# Patient Record
Sex: Male | Born: 2000 | Race: Black or African American | Hispanic: No | Marital: Single | Smoking: Never smoker
Health system: Southern US, Community
[De-identification: ages and names within clinical notes are randomized; demographics above are authoritative.]

## PROBLEM LIST (undated history)

## (undated) DIAGNOSIS — J45909 Unspecified asthma, uncomplicated: Secondary | ICD-10-CM

## (undated) DIAGNOSIS — Z974 Presence of external hearing-aid: Secondary | ICD-10-CM

---
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
## 2022-06-13 IMAGING — CR DG ANKLE COMPLETE 3+V*L*
3 series · 3 of 3 positions shown · non-contrast
Comparison: None.

CLINICAL DATA: Left ankle pain, wrestling injury

EXAM:
LEFT ANKLE COMPLETE - 3+ VIEW

[ankle ap]
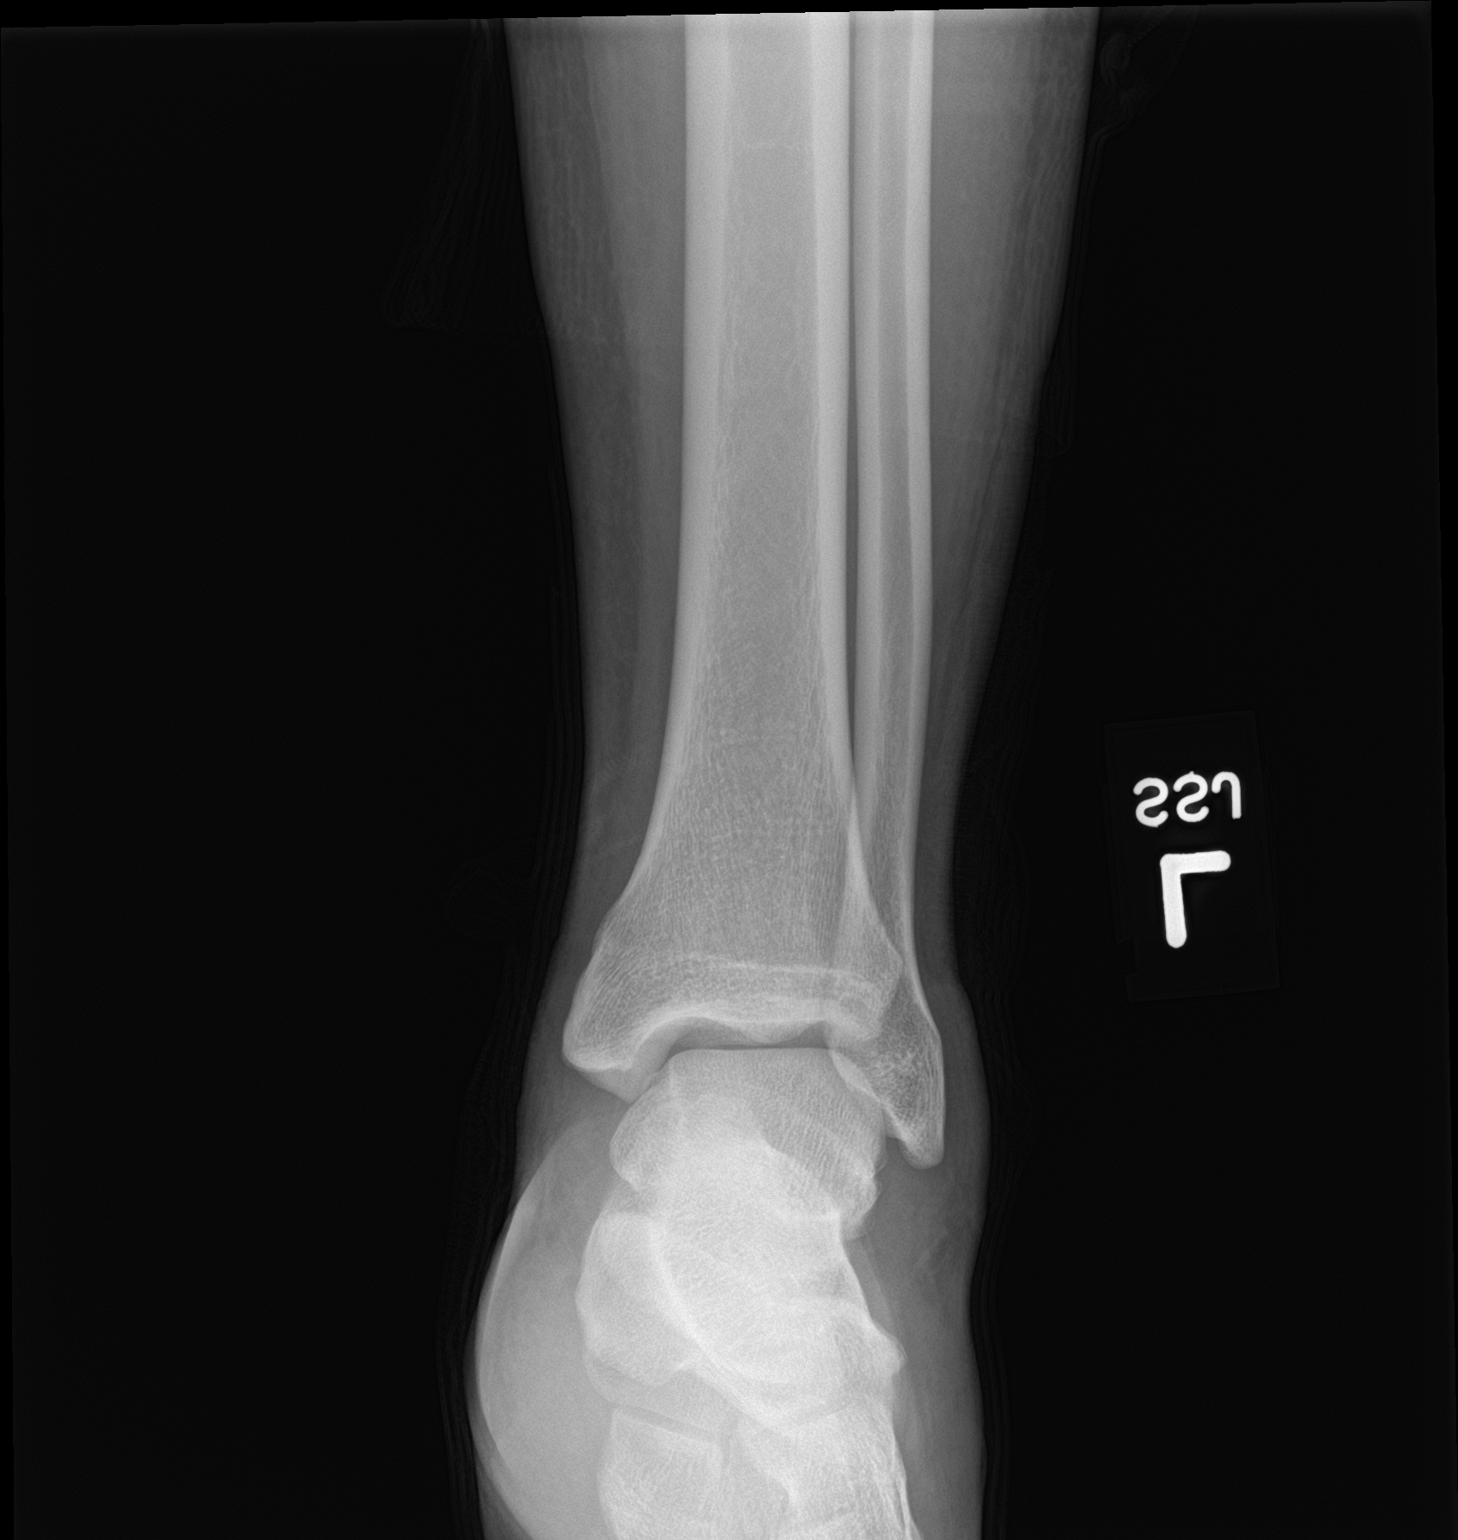

[ankle obl]
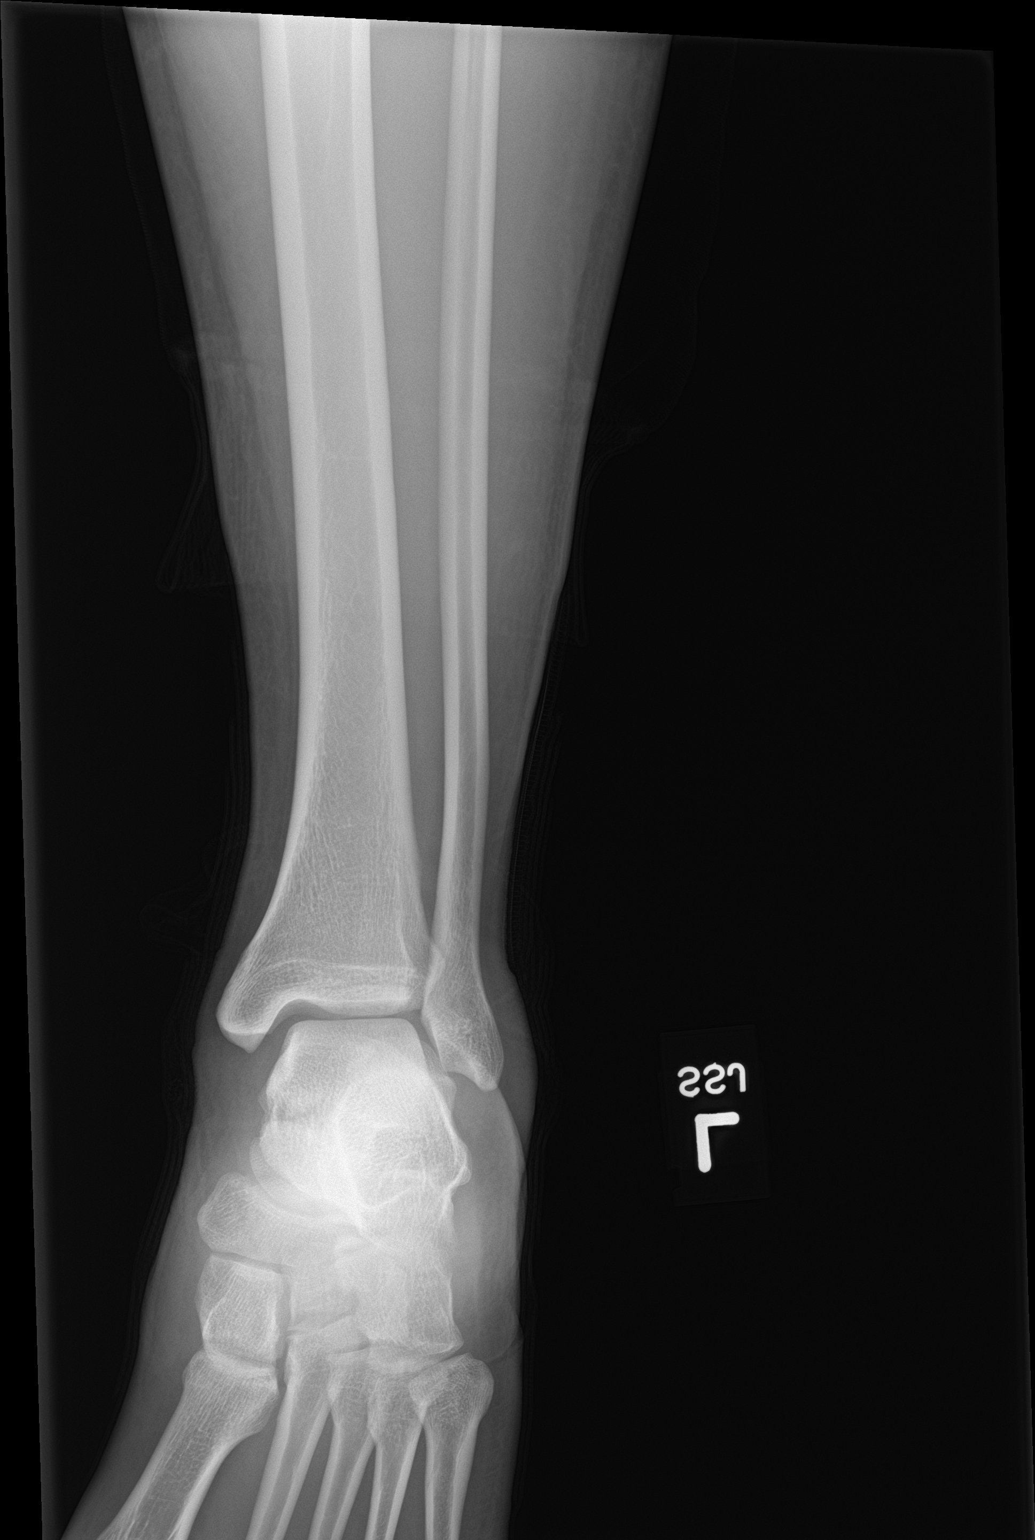

[ankle lat]
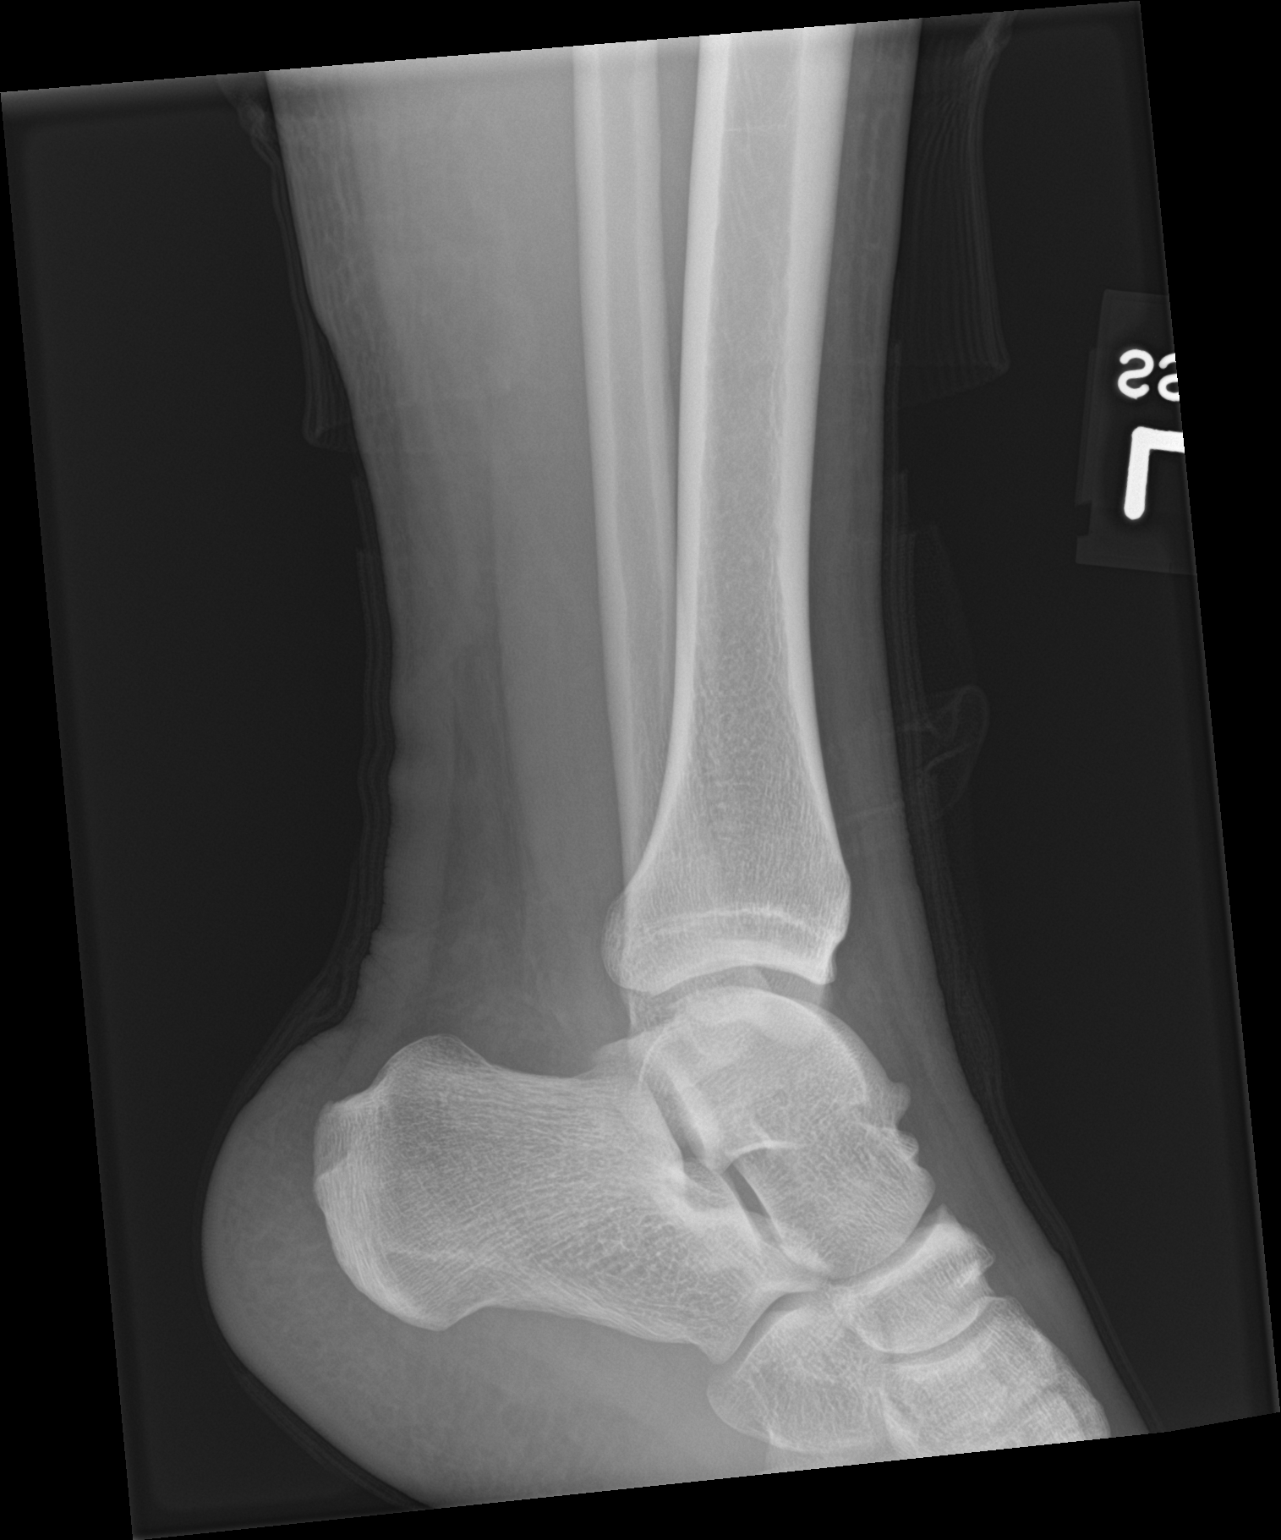

[3 of 3 positions shown; findings below may reference images not displayed]

FINDINGS: Frontal, oblique, and lateral views of the left ankle are obtained.
No fracture, subluxation, or dislocation. Joint spaces are well
preserved. Mild lateral soft tissue swelling.
IMPRESSION: 1. Lateral soft tissue swelling, no acute fracture.

## 2022-10-12 ENCOUNTER — Ambulatory Visit
Admission: EM | Admit: 2022-10-12 | Discharge: 2022-10-12 | Disposition: A | Discharge status: Home / Self Care | Payer: Commercial Managed Care - PPO | Attending: Emergency Medicine | Admitting: Emergency Medicine

## 2022-10-12 DIAGNOSIS — R051 Acute cough: Secondary | ICD-10-CM | POA: Diagnosis present

## 2022-10-12 DIAGNOSIS — Z1152 Encounter for screening for COVID-19: Secondary | ICD-10-CM | POA: Insufficient documentation

## 2022-10-12 DIAGNOSIS — J45901 Unspecified asthma with (acute) exacerbation: Secondary | ICD-10-CM | POA: Diagnosis not present

## 2022-10-12 DIAGNOSIS — J0100 Acute maxillary sinusitis, unspecified: Secondary | ICD-10-CM | POA: Diagnosis not present

## 2022-10-12 DIAGNOSIS — Z7952 Long term (current) use of systemic steroids: Secondary | ICD-10-CM | POA: Insufficient documentation

## 2022-10-12 DIAGNOSIS — Z792 Long term (current) use of antibiotics: Secondary | ICD-10-CM | POA: Insufficient documentation

## 2022-10-12 MED ORDER — PREDNISONE 10 MG PO TABS
40.0000 mg | ORAL_TABLET | Freq: Every day | ORAL | 0 refills | Status: AC
Start: 2022-10-12 — End: 2022-10-17

## 2022-10-12 MED ORDER — AZITHROMYCIN 250 MG PO TABS: 250.0000 mg | ORAL_TABLET | Freq: Every day | ORAL | 0 refills | Status: AC

## 2022-10-12 NOTE — Discharge Instructions (Addendum)
Continue to use your albuterol inhaler.  Take the prednisone and Zithromax as directed.  Follow up with your primary care provider.

## 2022-10-12 NOTE — ED Provider Notes (Signed)
Ronald Scott    CSN: [id number] Arrival date & time: 10/12/22  1343      History   Chief Complaint Chief Complaint  Patient presents with   Cough   chest congestion   Dizziness    HPI Ronald Scott is a 21 y.o. male.  Patient presents with 2-week history of congestion, cough, wheezing, shortness of breath.  Treatment attempted with OTC cold medications and his albuterol inhaler.  He denies fever, chills, sore throat, vomiting, diarrhea, or other symptoms.  His medical history includes asthma.   The history is provided by the patient and medical records.    Past Medical History:  Diagnosis Date   Asthma    Hearing aid worn     There are no problems to display for this patient.   History reviewed. No pertinent surgical history.     Home Medications    Prior to Admission medications   Medication Sig Start Date End Date Taking? Authorizing Provider  albuterol (PROVENTIL HFA;VENTOLIN HFA) 108 (90 BASE) MCG/ACT inhaler Inhale 4 puffs into the lungs every 4 (four) hours as needed for wheezing or shortness of breath. 04/07/15  Yes Loleta Rose, MD  azithromycin (ZITHROMAX) 250 MG tablet Take 1 tablet (250 mg total) by mouth daily. Take first 2 tablets together, then 1 every day until finished. 10/12/22  Yes Mickie Bail, NP  predniSONE (DELTASONE) 10 MG tablet Take 4 tablets (40 mg total) by mouth daily for 5 days. 10/12/22 10/17/22 Yes Mickie Bail, NP  albuterol (PROVENTIL) (2.5 MG/3ML) 0.083% nebulizer solution TAKE 1 VIAL VIA NEB EVERY 4 HOURS AS NEEDED FOR WHEEZE OR COUGH OR DIFFICULTY BREATHING 10/10/20 10/10/21  Page, Baxter Hire, MD  amphetamine-dextroamphetamine (ADDERALL XR) 20 MG 24 hr capsule TAKE 1 CAPSULE BY MOUTH ONCE DAILY 10/10/20 04/08/21  Page, Baxter Hire, MD  Dexmethylphenidate HCl (FOCALIN XR PO) Take by mouth.    [provider]    Family History History reviewed. No pertinent family history.  Social History Social History    Tobacco Use   Smoking status: Never   Smokeless tobacco: Never  Substance Use Topics   Alcohol use: No   Drug use: No     Allergies   Ibuprofen   Review of Systems Review of Systems  Constitutional:  Negative for chills and fever.  HENT:  Positive for congestion. Negative for ear pain and sore throat.   Respiratory:  Positive for cough, shortness of breath and wheezing.   Cardiovascular:  Negative for chest pain and palpitations.  Gastrointestinal:  Negative for diarrhea and vomiting.  Skin:  Negative for rash.  All other systems reviewed and are negative.    Physical Exam Triage Vital Signs ED Triage Vitals  Enc Vitals Group     BP 10/12/22 1402 108/70     Pulse Rate 10/12/22 1402 71     Resp 10/12/22 1402 16     Temp 10/12/22 1402 98.6 F (37 C)     Temp Source 10/12/22 1402 Oral     SpO2 10/12/22 1402 96 %     Weight --      Height --      Head Circumference --      Peak Flow --      Pain Score 10/12/22 1403 0     Pain Loc --      Pain Edu? --      Excl. in GC? --    No data found.  Updated Vital Signs BP 108/70 (  BP Location: Right Arm)   Pulse 71   Temp 98.6 F (37 C) (Oral)   Resp 16   SpO2 96%   Visual Acuity Right Eye Distance:   Left Eye Distance:   Bilateral Distance:    Right Eye Near:   Left Eye Near:    Bilateral Near:     Physical Exam Vitals and nursing note reviewed.  Constitutional:      General: He is not in acute distress.    Appearance: Normal appearance. He is well-developed. He is not ill-appearing.  HENT:     Right Ear: Tympanic membrane normal.     Left Ear: Tympanic membrane normal.     Nose: Nose normal.     Mouth/Throat:     Mouth: Mucous membranes are moist.     Pharynx: Oropharynx is clear.  Cardiovascular:     Rate and Rhythm: Normal rate and regular rhythm.     Heart sounds: Normal heart sounds.  Pulmonary:     Effort: Pulmonary effort is normal. No respiratory distress.     Breath sounds: Normal breath  sounds. No wheezing.  Musculoskeletal:     Cervical back: Neck supple.  Skin:    General: Skin is warm and dry.  Neurological:     Mental Status: He is alert.  Psychiatric:        Mood and Affect: Mood normal.        Behavior: Behavior normal.      UC Treatments / Results  Labs (all labs ordered are listed, but only abnormal results are displayed) Labs Reviewed  SARS CORONAVIRUS 2 (TAT 6-24 HRS)    EKG   Radiology No results found.  Procedures Procedures (including critical care time)  Medications Ordered in UC Medications - No data to display  Initial Impression / Assessment and Plan / UC Course  I have reviewed the triage vital signs and the nursing notes.  Pertinent labs & imaging results that were available during my care of the patient were reviewed by me and considered in my medical decision making (see chart for details).    Asthma exacerbation, acute sinusitis.  Per patient request, COVID pending.  Treating with Zithromax, prednisone, continued use of albuterol inhaler.  Instructed patient to follow up with his PCP if his symptoms are not improving.  Education provided on asthma and sinusitis.  He agrees to plan of care.    Final Clinical Impressions(s) / UC Diagnoses   Final diagnoses:  Asthma with acute exacerbation, unspecified asthma severity, unspecified whether persistent  Acute non-recurrent maxillary sinusitis     Discharge Instructions      Continue to use your albuterol inhaler.  Take the prednisone and Zithromax as directed.  Follow up with your primary care provider.        ED Prescriptions     Medication Sig Dispense Auth. Provider   predniSONE (DELTASONE) 10 MG tablet Take 4 tablets (40 mg total) by mouth daily for 5 days. 20 tablet Mickie Bail, NP   azithromycin (ZITHROMAX) 250 MG tablet Take 1 tablet (250 mg total) by mouth daily. Take first 2 tablets together, then 1 every day until finished. 6 tablet Mickie Bail, NP       PDMP not reviewed this encounter.   Mickie Bail, NP 10/12/22 1452

## 2022-10-12 NOTE — ED Triage Notes (Signed)
Cough and chest congestion that started about 2 weeks ago. Taking tylenol and cough, cold and flu OTC medication with no relief of symptoms.

## 2022-10-13 LAB — SARS CORONAVIRUS 2 (TAT 6-24 HRS): SARS Coronavirus 2: NEGATIVE

## 2024-08-21 ENCOUNTER — Ambulatory Visit (HOSPITAL_COMMUNITY)
Admission: EM | Admit: 2024-08-21 | Discharge: 2024-08-21 | Disposition: A | Discharge status: Home / Self Care | Payer: Self-pay | Attending: Internal Medicine | Admitting: Internal Medicine

## 2024-08-21 DIAGNOSIS — J340 Abscess, furuncle and carbuncle of nose: Secondary | ICD-10-CM

## 2024-08-21 DIAGNOSIS — J069 Acute upper respiratory infection, unspecified: Secondary | ICD-10-CM

## 2024-08-21 DIAGNOSIS — J4541 Moderate persistent asthma with (acute) exacerbation: Secondary | ICD-10-CM

## 2024-08-21 LAB — POC COVID19/FLU A&B COMBO
Covid Antigen, POC: NEGATIVE
Influenza A Antigen, POC: NEGATIVE
Influenza B Antigen, POC: NEGATIVE

## 2024-08-21 MED ORDER — ACETAMINOPHEN 325 MG PO TABS
ORAL_TABLET | ORAL | Status: AC
  Filled 2024-08-21: qty 3

## 2024-08-21 MED ORDER — METHYLPREDNISOLONE SODIUM SUCC 125 MG IJ SOLR
INTRAMUSCULAR | Status: AC
  Filled 2024-08-21: qty 2

## 2024-08-21 MED ORDER — IPRATROPIUM-ALBUTEROL 0.5-2.5 (3) MG/3ML IN SOLN
3.0000 mL | Freq: Once | RESPIRATORY_TRACT | Status: AC
Start: 2024-08-21 — End: 2024-08-21
  Administered 2024-08-21: 3 mL via RESPIRATORY_TRACT

## 2024-08-21 MED ORDER — IPRATROPIUM-ALBUTEROL 0.5-2.5 (3) MG/3ML IN SOLN
RESPIRATORY_TRACT | Status: AC
  Filled 2024-08-21: qty 3

## 2024-08-21 MED ORDER — ACETAMINOPHEN 325 MG PO TABS
975.0000 mg | ORAL_TABLET | Freq: Once | ORAL | Status: AC
  Administered 2024-08-21: 975 mg via ORAL

## 2024-08-21 MED ORDER — AMOXICILLIN-POT CLAVULANATE 875-125 MG PO TABS: 1.0000 | ORAL_TABLET | Freq: Two times a day (BID) | ORAL | 0 refills | Status: DC

## 2024-08-21 MED ORDER — METHYLPREDNISOLONE SODIUM SUCC 125 MG IJ SOLR
125.0000 mg | Freq: Once | INTRAMUSCULAR | Status: AC
  Administered 2024-08-21: 125 mg via INTRAMUSCULAR

## 2024-08-21 MED ORDER — PROMETHAZINE-DM 6.25-15 MG/5ML PO SYRP
5.0000 mL | ORAL_SOLUTION | Freq: Every evening | ORAL | 0 refills | Status: DC | PRN
Start: 2024-08-21 — End: 2024-08-22

## 2024-08-21 MED ORDER — PREDNISONE 20 MG PO TABS: 40.0000 mg | ORAL_TABLET | Freq: Every day | ORAL | 0 refills | Status: DC

## 2024-08-21 MED ORDER — ALBUTEROL SULFATE HFA 108 (90 BASE) MCG/ACT IN AERS: 2.0000 | INHALATION_SPRAY | Freq: Four times a day (QID) | RESPIRATORY_TRACT | 0 refills | Status: DC | PRN

## 2024-08-21 NOTE — ED Triage Notes (Signed)
 Pt present with a cough, chest congestion and rapid heart rate. Pt states he had a bump in his nose that might be infected. Pt states he has had trouble breathing.

## 2024-08-21 NOTE — Discharge Instructions (Addendum)
 Your symptoms are due to asthma attack.  COVID and flu testing are negative.   - Use albuterol  inhaler 2 puffs every 4-6 hours on a schedule for the next 24 hours, then as needed for cough, shortness of breath, and wheeze. - Take the steroid sent to the pharmacy as directed to help reduce lung inflammation and decrease the risk of another attack in the next few days. No NSAIDs like ibuprofen or aleve /naproxen  while taking steroid. Take with food to avoid stomach upset. - Cough medication as needed. Promethazine  DM will cause drowsiness, only use this at bedtime.   If your symptoms do not improve in the next 2-3 days with interventions, please return. Please seek medical care for new or returning symptoms, such as difficulty breathing that doesn't improve with your medications, chest pain, voice changes, high fevers, confusion, or other new or worsening symptoms. Follow-up with PCP for ongoing management of asthma.

## 2024-08-21 NOTE — ED Provider Notes (Addendum)
 [facility]    CSN: [id number] Arrival date & time: 08/21/24  1729      History   Chief Complaint Chief Complaint  Patient presents with   Shortness of Breath   Tachycardia   Chest Pain    HPI Ronald Scott is a 23 y.o. male.   Ronald Scott is a 23 y.o. male presenting for chief complaint of cough, shortness of breath, wheezing, generalized fatigue, congestion, and bilateral chest tightness associated with coughing that started 2 days ago.  Cough is productive with yellow/green sputum.  History of asthma, he does not currently have access to an albuterol  inhaler but was able to use his friends prior to arrival.  He used 2 puffs of albuterol  with minimal relief of shortness of breath.  Denies heart palpitations, leg swelling, dizziness, recent sick contacts with similar symptoms, nausea, vomiting, diarrhea, and abdominal pain.  He is unsure of max temp at home.  Temp is 101.0 here.  Denies recent antipyretic use.  He additionally reports pustular lesion to the left inner nostril starting 4 days ago.  Pustule ruptured and pus was excreted as well as bloody fluid.    Shortness of Breath Associated symptoms: chest pain   Chest Pain Associated symptoms: shortness of breath     Past Medical History:  Diagnosis Date   Asthma    Hearing aid worn     There are no active problems to display for this patient.   No past surgical history on file.     Home Medications    Prior to Admission medications   Medication Sig Start Date End Date Taking? Authorizing Provider  albuterol  (VENTOLIN  HFA) 108 (90 Base) MCG/ACT inhaler Inhale 2 puffs into the lungs every 6 (six) hours as needed for wheezing or shortness of breath. 08/21/24  Yes Enedelia Dorna HERO, FNP  amoxicillin -clavulanate (AUGMENTIN ) 875-125 MG tablet Take 1 tablet by mouth every 12 (twelve) hours. 08/21/24  Yes Enedelia Dorna HERO, FNP  predniSONE  (DELTASONE ) 20 MG tablet Take 2  tablets (40 mg total) by mouth daily with breakfast for 5 days. 08/21/24 08/26/24 Yes StanhopeDorna HERO, FNP  promethazine -dextromethorphan (PROMETHAZINE -DM) 6.25-15 MG/5ML syrup Take 5 mLs by mouth at bedtime as needed for cough. 08/21/24  Yes Enedelia Dorna HERO, FNP  amphetamine-dextroamphetamine (ADDERALL XR) 20 MG 24 hr capsule TAKE 1 CAPSULE BY MOUTH ONCE DAILY 10/10/20 04/08/21  Page, Josette, MD  azithromycin  (ZITHROMAX ) 250 MG tablet Take 1 tablet (250 mg total) by mouth daily. Take first 2 tablets together, then 1 every day until finished. 10/12/22   Corlis Burnard DEL, NP  Dexmethylphenidate HCl (FOCALIN XR PO) Take by mouth.    [provider]    Family History No family history on file.  Social History Social History   Tobacco Use   Smoking status: Never   Smokeless tobacco: Never  Substance Use Topics   Alcohol use: No   Drug use: No     Allergies   Ibuprofen   Review of Systems Review of Systems  Respiratory:  Positive for shortness of breath.   Cardiovascular:  Positive for chest pain.  Per HPI   Physical Exam Triage Vital Signs ED Triage Vitals  Encounter Vitals Group     BP 08/21/24 1733 122/86     Girls Systolic BP Percentile --      Girls Diastolic BP Percentile --      Boys Systolic BP Percentile --      Boys Diastolic BP Percentile --  Pulse Rate 08/21/24 1729 (!) 142     Resp 08/21/24 1729 (!) 23     Temp 08/21/24 1733 (!) 101 F (38.3 C)     Temp Source 08/21/24 1733 Oral     SpO2 08/21/24 1729 94 %     Weight --      Height --      Head Circumference --      Peak Flow --      Pain Score --      Pain Loc --      Pain Education --      Exclude from Growth Chart --    No data found.  Updated Vital Signs BP 122/86 (BP Location: Right Arm)   Pulse (!) 142   Temp (!) 101 F (38.3 C) (Oral)   Resp (!) 23   SpO2 94%   Visual Acuity Right Eye Distance:   Left Eye Distance:   Bilateral Distance:    Right Eye Near:    Left Eye Near:    Bilateral Near:     Physical Exam Vitals and nursing note reviewed.  Constitutional:      Appearance: He is not ill-appearing or toxic-appearing.  HENT:     Head: Normocephalic and atraumatic.     Right Ear: Hearing and external ear normal.     Left Ear: Hearing and external ear normal.     Nose: Congestion present.      Mouth/Throat:     Lips: Pink.  Eyes:     General: Lids are normal. Vision grossly intact. Gaze aligned appropriately.     Extraocular Movements: Extraocular movements intact.     Conjunctiva/sclera: Conjunctivae normal.  Cardiovascular:     Rate and Rhythm: Normal rate and regular rhythm.     Heart sounds: Normal heart sounds, S1 normal and S2 normal.  Pulmonary:     Effort: Pulmonary effort is normal. No respiratory distress.     Breath sounds: Normal air entry. Decreased breath sounds (Diminished breath sounds throughout.) and wheezing (Inspiratory and expiratory wheezing heard to all lung fields bilaterally.) present. No rhonchi or rales.     Comments: Speaking in full sentences without difficulty.  Musculoskeletal:     Cervical back: Neck supple.  Skin:    General: Skin is warm and dry.     Capillary Refill: Capillary refill takes less than 2 seconds.     Findings: No rash.  Neurological:     General: No focal deficit present.     Mental Status: He is alert and oriented to person, place, and time. Mental status is at baseline.     Cranial Nerves: No dysarthria or facial asymmetry.  Psychiatric:        Mood and Affect: Mood normal.        Speech: Speech normal.        Behavior: Behavior normal.        Thought Content: Thought content normal.        Judgment: Judgment normal.      UC Treatments / Results  Labs (all labs ordered are listed, but only abnormal results are displayed) Labs Reviewed  POC COVID19/FLU A&B COMBO    EKG   Radiology No results found.  Procedures Procedures (including critical care  time)  Medications Ordered in UC Medications  acetaminophen  (TYLENOL ) tablet 975 mg (975 mg Oral Given 08/21/24 1738)  ipratropium-albuterol  (DUONEB) 0.5-2.5 (3) MG/3ML nebulizer solution 3 mL (3 mLs Nebulization Given 08/21/24 1800)  methylPREDNISolone  sodium succinate (SOLU-MEDROL )  125 mg/2 mL injection 125 mg (125 mg Intramuscular Given 08/21/24 1800)    Initial Impression / Assessment and Plan / UC Course  I have reviewed the triage vital signs and the nursing notes.  Pertinent labs & imaging results that were available during my care of the patient were reviewed by me and considered in my medical decision making (see chart for details).   1. Moderate persistent asthma with acute exacerbation, viral URI with cough Presentation consistent with acute asthma exacerbation.   DuoNeb nebulizer treatment given with significant symptomatic improvement reported by patient and lung sounds on reassessment.   Non-focal lung assessment after nebulizer treatment and low suspicion for acute pneumonia/focal finding, therefore deferred imaging of the chest.   Viral testing: Point-of-care COVID-19 and flu testing is negative.  Solu-Medrol  125 IM steroid given today, Prednisone  burst to be started tomorrow.   Will treat with prednisone  burst, albuterol  PRN, and antitussive PRN at bedtime.   Temp reduced 98.8, heart rate 107, respirations 18 at discharge.  Discussed PCP follow-up to discuss asthma action plan to prevent future asthma exacerbations.   2.  Abscess of nose Abscess of the left inner nostril-Augmentin  twice daily for 7 days. Warm compresses. Advised to watch for new/worsening signs of infection.  Counseled patient on potential for adverse effects with medications prescribed/recommended today, strict ER and return-to-clinic precautions discussed, patient verbalized understanding.    Final Clinical Impressions(s) / UC Diagnoses   Final diagnoses:  Moderate persistent asthma with  acute exacerbation  Viral URI with cough  Abscess of nose     Discharge Instructions      Your symptoms are due to asthma attack.  COVID and flu testing are negative.   - Use albuterol  inhaler 2 puffs every 4-6 hours on a schedule for the next 24 hours, then as needed for cough, shortness of breath, and wheeze. - Take the steroid sent to the pharmacy as directed to help reduce lung inflammation and decrease the risk of another attack in the next few days. No NSAIDs like ibuprofen or aleve /naproxen  while taking steroid. Take with food to avoid stomach upset. - Cough medication as needed. Promethazine  DM will cause drowsiness, only use this at bedtime.   If your symptoms do not improve in the next 2-3 days with interventions, please return. Please seek medical care for new or returning symptoms, such as difficulty breathing that doesn't improve with your medications, chest pain, voice changes, high fevers, confusion, or other new or worsening symptoms. Follow-up with PCP for ongoing management of asthma.     ED Prescriptions     Medication Sig Dispense Auth. Provider   albuterol  (VENTOLIN  HFA) 108 (90 Base) MCG/ACT inhaler Inhale 2 puffs into the lungs every 6 (six) hours as needed for wheezing or shortness of breath. 18 g Anabel Lykins M, FNP   promethazine -dextromethorphan (PROMETHAZINE -DM) 6.25-15 MG/5ML syrup Take 5 mLs by mouth at bedtime as needed for cough. 118 mL Enedelia Going M, FNP   predniSONE  (DELTASONE ) 20 MG tablet Take 2 tablets (40 mg total) by mouth daily with breakfast for 5 days. 10 tablet Enedelia Going HERO, FNP   amoxicillin -clavulanate (AUGMENTIN ) 875-125 MG tablet Take 1 tablet by mouth every 12 (twelve) hours. 14 tablet Enedelia Going HERO, FNP      PDMP not reviewed this encounter.   Enedelia Going HERO, FNP 08/21/24 1904    Enedelia Going HERO, FNP 08/21/24 1905

## 2024-08-22 ENCOUNTER — Telehealth (HOSPITAL_COMMUNITY): Payer: Self-pay

## 2024-08-22 MED ORDER — PROMETHAZINE-DM 6.25-15 MG/5ML PO SYRP: 5.0000 mL | ORAL_SOLUTION | Freq: Every evening | ORAL | 0 refills | Status: AC | PRN

## 2024-08-22 MED ORDER — ALBUTEROL SULFATE HFA 108 (90 BASE) MCG/ACT IN AERS: 2.0000 | INHALATION_SPRAY | Freq: Four times a day (QID) | RESPIRATORY_TRACT | 0 refills | Status: AC | PRN

## 2024-08-22 MED ORDER — AMOXICILLIN-POT CLAVULANATE 875-125 MG PO TABS: 1.0000 | ORAL_TABLET | Freq: Two times a day (BID) | ORAL | 0 refills | Status: AC

## 2024-08-22 MED ORDER — PREDNISONE 20 MG PO TABS: 40.0000 mg | ORAL_TABLET | Freq: Every day | ORAL | 0 refills | Status: AC

## 2024-08-22 NOTE — Telephone Encounter (Signed)
Pharmacy updated per pt request.

## 2024-12-26 ENCOUNTER — Ambulatory Visit: Admission: EM | Admit: 2024-12-26 | Discharge: 2024-12-26 | Disposition: A | Discharge status: Home / Self Care

## 2024-12-26 ENCOUNTER — Encounter: Payer: Self-pay | Admitting: Emergency Medicine

## 2024-12-26 DIAGNOSIS — Z7689 Persons encountering health services in other specified circumstances: Secondary | ICD-10-CM

## 2024-12-26 DIAGNOSIS — Z09 Encounter for follow-up examination after completed treatment for conditions other than malignant neoplasm: Secondary | ICD-10-CM

## 2024-12-26 NOTE — Discharge Instructions (Addendum)
You may return to work without restriction

## 2024-12-26 NOTE — ED Triage Notes (Signed)
 Patient reports that she was sick last Thursday -Sunday. Patient now needs a note saying he can return to work. Patient voices no complaints at this time.

## 2024-12-26 NOTE — ED Provider Notes (Addendum)
 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   Patient presents for note to return to work after upper respiratory infection occurring between 12/21/2018 states and at 12/04/2024.  Experiencing cough and congestion which all have resolved.  History of asthma.  Denies shortness of breath or wheezing.  Past Medical History:  Diagnosis Date   Asthma    Hearing aid worn     There are no active problems to display for this patient.   History reviewed. No pertinent surgical history.     Home Medications    Prior to Admission medications  Medication Sig Start Date End Date Taking? Authorizing Provider  albuterol  (VENTOLIN  HFA) 108 (90 Base) MCG/ACT inhaler Inhale 2 puffs into the lungs every 6 (six) hours as needed for wheezing or shortness of breath. 08/22/24   Enedelia Dorna HERO, FNP  amoxicillin -clavulanate (AUGMENTIN ) 875-125 MG tablet Take 1 tablet by mouth every 12 (twelve) hours. 08/22/24   Enedelia Dorna HERO, FNP  amphetamine-dextroamphetamine (ADDERALL XR) 20 MG 24 hr capsule TAKE 1 CAPSULE BY MOUTH ONCE DAILY 10/10/20 04/08/21  Page, Josette, MD  azithromycin  (ZITHROMAX ) 250 MG tablet Take 1 tablet (250 mg total) by mouth daily. Take first 2 tablets together, then 1 every day until finished. 10/12/22   Corlis Burnard DEL, NP  Dexmethylphenidate HCl (FOCALIN XR PO) Take by mouth.    [provider]  promethazine -dextromethorphan (PROMETHAZINE -DM) 6.25-15 MG/5ML syrup Take 5 mLs by mouth at bedtime as needed for cough. 08/22/24   Enedelia Dorna HERO, FNP    Family History No family history on file.  Social History Social History[1]   Allergies   Ibuprofen   Review of Systems Review of Systems  All other systems reviewed and are negative.    Physical Exam Triage Vital  Signs ED Triage Vitals  Encounter Vitals Group     BP 12/26/24 1600 100/68     Girls Systolic BP Percentile --      Girls Diastolic BP Percentile --      Boys Systolic BP Percentile --      Boys Diastolic BP Percentile --      Pulse Rate 12/26/24 1600 73     Resp 12/26/24 1600 19     Temp 12/26/24 1600 98.1 F (36.7 C)     Temp Source 12/26/24 1600 Oral     SpO2 12/26/24 1600 98 %     Weight --      Height --      Head Circumference --      Peak Flow --      Pain Score 12/26/24 1603 0     Pain Loc --      Pain Education --      Exclude from Growth Chart --    No data found.  Updated Vital Signs BP 100/68 (BP Location: Right Arm)   Pulse 73   Temp 98.1 F (36.7 C) (Oral)   Resp 19   SpO2 98%   Visual Acuity Right Eye Distance:   Left Eye Distance:   Bilateral Distance:    Right Eye Near:   Left Eye Near:    Bilateral Near:     Physical Exam Constitutional:      Appearance: Normal appearance.  Eyes:  Extraocular Movements: Extraocular movements intact.  Cardiovascular:     Rate and Rhythm: Normal rate and regular rhythm.     Pulses: Normal pulses.     Heart sounds: Normal heart sounds.  Pulmonary:     Effort: Pulmonary effort is normal.     Breath sounds: Normal breath sounds.  Neurological:     Mental Status: He is alert and oriented to person, place, and time.      UC Treatments / Results  Labs (all labs ordered are listed, but only abnormal results are displayed) Labs Reviewed - No data to display  EKG   Radiology No results found.  Procedures Procedures (including critical care time)  Medications Ordered in UC Medications - No data to display  Initial Impression / Assessment and Plan / UC Course  I have reviewed the triage vital signs and the nursing notes.  Pertinent labs & imaging results that were available during my care of the patient were reviewed by me and considered in my medical decision making (see chart for  details).  Return to work evaluation  Respiratory symptoms have resolved, may return to work without restriction Final Clinical Impressions(s) / UC Diagnoses   Final diagnoses:  Return to work evaluation     Discharge Instructions      You  may return to work without restriction   ED Prescriptions   None    PDMP not reviewed this encounter.    Teresa Shelba SAUNDERS, NP 12/26/24 1606     [1]  Social History Tobacco Use   Smoking status: Never   Smokeless tobacco: Never  Substance Use Topics   Alcohol use: No   Drug use: No     Teresa Shelba SAUNDERS, NP 12/26/24 1608  "
